# Patient Record
Sex: Female | Born: 1945 | Race: Black or African American | Hispanic: No
Health system: Southern US, Community
[De-identification: ages and names within clinical notes are randomized; demographics above are authoritative.]

## PROBLEM LIST (undated history)

## (undated) DIAGNOSIS — I10 Essential (primary) hypertension: Secondary | ICD-10-CM

## (undated) DIAGNOSIS — E119 Type 2 diabetes mellitus without complications: Secondary | ICD-10-CM

## (undated) DIAGNOSIS — I509 Heart failure, unspecified: Secondary | ICD-10-CM

---

## 2003-11-06 ENCOUNTER — Emergency Department (HOSPITAL_COMMUNITY): Admission: EM | Admit: 2003-11-06 | Discharge: 2003-11-06 | Payer: Self-pay | Admitting: *Deleted

## 2004-06-15 ENCOUNTER — Encounter: Admission: RE | Admit: 2004-06-15 | Discharge: 2004-06-15 | Payer: Self-pay | Admitting: Family Medicine

## 2014-09-13 DIAGNOSIS — Z9114 Patient's other noncompliance with medication regimen: Secondary | ICD-10-CM | POA: Diagnosis not present

## 2014-09-13 DIAGNOSIS — E785 Hyperlipidemia, unspecified: Secondary | ICD-10-CM | POA: Diagnosis not present

## 2014-09-13 DIAGNOSIS — E1165 Type 2 diabetes mellitus with hyperglycemia: Secondary | ICD-10-CM | POA: Diagnosis not present

## 2014-09-13 DIAGNOSIS — R531 Weakness: Secondary | ICD-10-CM | POA: Diagnosis not present

## 2014-09-13 DIAGNOSIS — E1142 Type 2 diabetes mellitus with diabetic polyneuropathy: Secondary | ICD-10-CM | POA: Diagnosis not present

## 2014-09-13 DIAGNOSIS — I10 Essential (primary) hypertension: Secondary | ICD-10-CM | POA: Diagnosis not present

## 2014-09-13 DIAGNOSIS — E1140 Type 2 diabetes mellitus with diabetic neuropathy, unspecified: Secondary | ICD-10-CM | POA: Diagnosis not present

## 2014-09-25 DIAGNOSIS — Z6828 Body mass index (BMI) 28.0-28.9, adult: Secondary | ICD-10-CM | POA: Diagnosis not present

## 2014-09-25 DIAGNOSIS — E1165 Type 2 diabetes mellitus with hyperglycemia: Secondary | ICD-10-CM | POA: Diagnosis not present

## 2014-11-01 DIAGNOSIS — Z6829 Body mass index (BMI) 29.0-29.9, adult: Secondary | ICD-10-CM | POA: Diagnosis not present

## 2014-11-01 DIAGNOSIS — I10 Essential (primary) hypertension: Secondary | ICD-10-CM | POA: Diagnosis not present

## 2014-11-01 DIAGNOSIS — E1140 Type 2 diabetes mellitus with diabetic neuropathy, unspecified: Secondary | ICD-10-CM | POA: Diagnosis not present

## 2014-11-01 DIAGNOSIS — R531 Weakness: Secondary | ICD-10-CM | POA: Diagnosis not present

## 2014-11-12 DIAGNOSIS — E1140 Type 2 diabetes mellitus with diabetic neuropathy, unspecified: Secondary | ICD-10-CM | POA: Diagnosis not present

## 2014-11-12 DIAGNOSIS — Z0000 Encounter for general adult medical examination without abnormal findings: Secondary | ICD-10-CM | POA: Diagnosis not present

## 2014-12-28 DIAGNOSIS — H2513 Age-related nuclear cataract, bilateral: Secondary | ICD-10-CM | POA: Diagnosis not present

## 2015-01-14 DIAGNOSIS — E785 Hyperlipidemia, unspecified: Secondary | ICD-10-CM | POA: Diagnosis not present

## 2015-01-14 DIAGNOSIS — I10 Essential (primary) hypertension: Secondary | ICD-10-CM | POA: Diagnosis not present

## 2015-01-14 DIAGNOSIS — E1140 Type 2 diabetes mellitus with diabetic neuropathy, unspecified: Secondary | ICD-10-CM | POA: Diagnosis not present

## 2015-01-14 DIAGNOSIS — E1142 Type 2 diabetes mellitus with diabetic polyneuropathy: Secondary | ICD-10-CM | POA: Diagnosis not present

## 2015-01-14 DIAGNOSIS — E538 Deficiency of other specified B group vitamins: Secondary | ICD-10-CM | POA: Diagnosis not present

## 2015-01-25 ENCOUNTER — Emergency Department (HOSPITAL_COMMUNITY): Payer: Medicare Other

## 2015-01-25 ENCOUNTER — Encounter (HOSPITAL_COMMUNITY): Payer: Self-pay

## 2015-01-25 ENCOUNTER — Inpatient Hospital Stay (HOSPITAL_COMMUNITY)
Admission: EM | Admit: 2015-01-25 | Discharge: 2015-01-25 | DRG: 062 | Disposition: A | Discharge status: Other Acute Inpatient Hospital | Payer: Medicare Other | Attending: Neurology | Admitting: Neurology

## 2015-01-25 ENCOUNTER — Inpatient Hospital Stay (HOSPITAL_COMMUNITY): Payer: Medicare Other

## 2015-01-25 DIAGNOSIS — R1310 Dysphagia, unspecified: Secondary | ICD-10-CM | POA: Diagnosis not present

## 2015-01-25 DIAGNOSIS — I63511 Cerebral infarction due to unspecified occlusion or stenosis of right middle cerebral artery: Secondary | ICD-10-CM | POA: Diagnosis not present

## 2015-01-25 DIAGNOSIS — Z9282 Status post administration of tPA (rtPA) in a different facility within the last 24 hours prior to admission to current facility: Secondary | ICD-10-CM | POA: Diagnosis not present

## 2015-01-25 DIAGNOSIS — I663 Occlusion and stenosis of cerebellar arteries: Secondary | ICD-10-CM | POA: Diagnosis not present

## 2015-01-25 DIAGNOSIS — R471 Dysarthria and anarthria: Secondary | ICD-10-CM | POA: Diagnosis not present

## 2015-01-25 DIAGNOSIS — Z9889 Other specified postprocedural states: Secondary | ICD-10-CM | POA: Diagnosis not present

## 2015-01-25 DIAGNOSIS — Z8249 Family history of ischemic heart disease and other diseases of the circulatory system: Secondary | ICD-10-CM | POA: Diagnosis not present

## 2015-01-25 DIAGNOSIS — J811 Chronic pulmonary edema: Secondary | ICD-10-CM | POA: Diagnosis not present

## 2015-01-25 DIAGNOSIS — I110 Hypertensive heart disease with heart failure: Secondary | ICD-10-CM | POA: Diagnosis not present

## 2015-01-25 DIAGNOSIS — I340 Nonrheumatic mitral (valve) insufficiency: Secondary | ICD-10-CM | POA: Diagnosis not present

## 2015-01-25 DIAGNOSIS — N189 Chronic kidney disease, unspecified: Secondary | ICD-10-CM | POA: Diagnosis not present

## 2015-01-25 DIAGNOSIS — R404 Transient alteration of awareness: Secondary | ICD-10-CM | POA: Diagnosis not present

## 2015-01-25 DIAGNOSIS — I509 Heart failure, unspecified: Secondary | ICD-10-CM | POA: Diagnosis not present

## 2015-01-25 DIAGNOSIS — R739 Hyperglycemia, unspecified: Secondary | ICD-10-CM | POA: Diagnosis not present

## 2015-01-25 DIAGNOSIS — F05 Delirium due to known physiological condition: Secondary | ICD-10-CM | POA: Diagnosis not present

## 2015-01-25 DIAGNOSIS — G8194 Hemiplegia, unspecified affecting left nondominant side: Secondary | ICD-10-CM | POA: Diagnosis not present

## 2015-01-25 DIAGNOSIS — B965 Pseudomonas (aeruginosa) (mallei) (pseudomallei) as the cause of diseases classified elsewhere: Secondary | ICD-10-CM | POA: Diagnosis not present

## 2015-01-25 DIAGNOSIS — J9601 Acute respiratory failure with hypoxia: Secondary | ICD-10-CM | POA: Diagnosis not present

## 2015-01-25 DIAGNOSIS — Z515 Encounter for palliative care: Secondary | ICD-10-CM | POA: Diagnosis not present

## 2015-01-25 DIAGNOSIS — H53462 Homonymous bilateral field defects, left side: Secondary | ICD-10-CM | POA: Diagnosis not present

## 2015-01-25 DIAGNOSIS — I6789 Other cerebrovascular disease: Secondary | ICD-10-CM | POA: Diagnosis not present

## 2015-01-25 DIAGNOSIS — I10 Essential (primary) hypertension: Secondary | ICD-10-CM | POA: Diagnosis not present

## 2015-01-25 DIAGNOSIS — I639 Cerebral infarction, unspecified: Secondary | ICD-10-CM | POA: Diagnosis present

## 2015-01-25 DIAGNOSIS — G936 Cerebral edema: Secondary | ICD-10-CM | POA: Diagnosis not present

## 2015-01-25 DIAGNOSIS — E785 Hyperlipidemia, unspecified: Secondary | ICD-10-CM | POA: Diagnosis not present

## 2015-01-25 DIAGNOSIS — Z4682 Encounter for fitting and adjustment of non-vascular catheter: Secondary | ICD-10-CM | POA: Diagnosis not present

## 2015-01-25 DIAGNOSIS — B961 Klebsiella pneumoniae [K. pneumoniae] as the cause of diseases classified elsewhere: Secondary | ICD-10-CM | POA: Diagnosis not present

## 2015-01-25 DIAGNOSIS — F0390 Unspecified dementia without behavioral disturbance: Secondary | ICD-10-CM | POA: Diagnosis not present

## 2015-01-25 DIAGNOSIS — I313 Pericardial effusion (noninflammatory): Secondary | ICD-10-CM | POA: Diagnosis not present

## 2015-01-25 DIAGNOSIS — N179 Acute kidney failure, unspecified: Secondary | ICD-10-CM | POA: Diagnosis not present

## 2015-01-25 DIAGNOSIS — N390 Urinary tract infection, site not specified: Secondary | ICD-10-CM | POA: Diagnosis not present

## 2015-01-25 DIAGNOSIS — R918 Other nonspecific abnormal finding of lung field: Secondary | ICD-10-CM | POA: Diagnosis not present

## 2015-01-25 DIAGNOSIS — R29810 Facial weakness: Secondary | ICD-10-CM | POA: Diagnosis not present

## 2015-01-25 DIAGNOSIS — E160 Drug-induced hypoglycemia without coma: Secondary | ICD-10-CM | POA: Diagnosis not present

## 2015-01-25 DIAGNOSIS — R1312 Dysphagia, oropharyngeal phase: Secondary | ICD-10-CM | POA: Diagnosis not present

## 2015-01-25 DIAGNOSIS — I358 Other nonrheumatic aortic valve disorders: Secondary | ICD-10-CM | POA: Diagnosis not present

## 2015-01-25 DIAGNOSIS — I5023 Acute on chronic systolic (congestive) heart failure: Secondary | ICD-10-CM | POA: Diagnosis not present

## 2015-01-25 DIAGNOSIS — I6529 Occlusion and stenosis of unspecified carotid artery: Secondary | ICD-10-CM | POA: Diagnosis not present

## 2015-01-25 DIAGNOSIS — I071 Rheumatic tricuspid insufficiency: Secondary | ICD-10-CM | POA: Diagnosis not present

## 2015-01-25 DIAGNOSIS — I6523 Occlusion and stenosis of bilateral carotid arteries: Secondary | ICD-10-CM | POA: Diagnosis not present

## 2015-01-25 DIAGNOSIS — I130 Hypertensive heart and chronic kidney disease with heart failure and stage 1 through stage 4 chronic kidney disease, or unspecified chronic kidney disease: Secondary | ICD-10-CM | POA: Diagnosis not present

## 2015-01-25 DIAGNOSIS — E118 Type 2 diabetes mellitus with unspecified complications: Secondary | ICD-10-CM | POA: Diagnosis not present

## 2015-01-25 DIAGNOSIS — I63311 Cerebral infarction due to thrombosis of right middle cerebral artery: Secondary | ICD-10-CM | POA: Diagnosis not present

## 2015-01-25 DIAGNOSIS — I517 Cardiomegaly: Secondary | ICD-10-CM | POA: Diagnosis not present

## 2015-01-25 DIAGNOSIS — G8104 Flaccid hemiplegia affecting left nondominant side: Secondary | ICD-10-CM | POA: Diagnosis not present

## 2015-01-25 DIAGNOSIS — I672 Cerebral atherosclerosis: Secondary | ICD-10-CM | POA: Diagnosis not present

## 2015-01-25 DIAGNOSIS — E1140 Type 2 diabetes mellitus with diabetic neuropathy, unspecified: Secondary | ICD-10-CM | POA: Diagnosis not present

## 2015-01-25 DIAGNOSIS — I618 Other nontraumatic intracerebral hemorrhage: Secondary | ICD-10-CM | POA: Diagnosis not present

## 2015-01-25 DIAGNOSIS — E119 Type 2 diabetes mellitus without complications: Secondary | ICD-10-CM | POA: Diagnosis present

## 2015-01-25 DIAGNOSIS — I63411 Cerebral infarction due to embolism of right middle cerebral artery: Secondary | ICD-10-CM | POA: Diagnosis not present

## 2015-01-25 DIAGNOSIS — R29721 NIHSS score 21: Secondary | ICD-10-CM | POA: Diagnosis present

## 2015-01-25 DIAGNOSIS — R410 Disorientation, unspecified: Secondary | ICD-10-CM | POA: Diagnosis not present

## 2015-01-25 DIAGNOSIS — E46 Unspecified protein-calorie malnutrition: Secondary | ICD-10-CM | POA: Diagnosis not present

## 2015-01-25 DIAGNOSIS — I6782 Cerebral ischemia: Secondary | ICD-10-CM | POA: Diagnosis not present

## 2015-01-25 DIAGNOSIS — E876 Hypokalemia: Secondary | ICD-10-CM | POA: Diagnosis not present

## 2015-01-25 DIAGNOSIS — G9340 Encephalopathy, unspecified: Secondary | ICD-10-CM | POA: Diagnosis not present

## 2015-01-25 DIAGNOSIS — T383X5A Adverse effect of insulin and oral hypoglycemic [antidiabetic] drugs, initial encounter: Secondary | ICD-10-CM | POA: Diagnosis not present

## 2015-01-25 DIAGNOSIS — G9341 Metabolic encephalopathy: Secondary | ICD-10-CM | POA: Diagnosis not present

## 2015-01-25 DIAGNOSIS — I5022 Chronic systolic (congestive) heart failure: Secondary | ICD-10-CM | POA: Diagnosis not present

## 2015-01-25 DIAGNOSIS — E1165 Type 2 diabetes mellitus with hyperglycemia: Secondary | ICD-10-CM | POA: Diagnosis not present

## 2015-01-25 DIAGNOSIS — I610 Nontraumatic intracerebral hemorrhage in hemisphere, subcortical: Secondary | ICD-10-CM | POA: Diagnosis not present

## 2015-01-25 DIAGNOSIS — E870 Hyperosmolality and hypernatremia: Secondary | ICD-10-CM | POA: Diagnosis not present

## 2015-01-25 HISTORY — DX: Heart failure, unspecified: I50.9

## 2015-01-25 HISTORY — DX: Type 2 diabetes mellitus without complications: E11.9

## 2015-01-25 HISTORY — DX: Essential (primary) hypertension: I10

## 2015-01-25 LAB — DIFFERENTIAL
BASOS PCT: 0 %
Basophils Absolute: 0 10*3/uL (ref 0.0–0.1)
EOS PCT: 2 %
Eosinophils Absolute: 0.2 10*3/uL (ref 0.0–0.7)
Lymphocytes Relative: 28 %
Lymphs Abs: 2.3 10*3/uL (ref 0.7–4.0)
MONO ABS: 1 10*3/uL (ref 0.1–1.0)
Monocytes Relative: 12 %
NEUTROS ABS: 4.8 10*3/uL (ref 1.7–7.7)
NEUTROS PCT: 58 %

## 2015-01-25 LAB — COMPREHENSIVE METABOLIC PANEL
ALBUMIN: 3.5 g/dL (ref 3.5–5.0)
ALT: 17 U/L (ref 14–54)
ANION GAP: 8 (ref 5–15)
AST: 15 U/L (ref 15–41)
Alkaline Phosphatase: 52 U/L (ref 38–126)
BUN: 15 mg/dL (ref 6–20)
CHLORIDE: 106 mmol/L (ref 101–111)
CO2: 27 mmol/L (ref 22–32)
Calcium: 10 mg/dL (ref 8.9–10.3)
Creatinine, Ser: 0.79 mg/dL (ref 0.44–1.00)
GFR calc non Af Amer: >60 mL/min (ref >60)
GLUCOSE: 181 mg/dL — AB (ref 65–99)
Potassium: 3.3 mmol/L — ABNORMAL LOW (ref 3.5–5.1)
SODIUM: 141 mmol/L (ref 135–145)
Total Bilirubin: 0.9 mg/dL (ref 0.3–1.2)
Total Protein: 6.5 g/dL (ref 6.5–8.1)

## 2015-01-25 LAB — PROTIME-INR
INR: 1.1 (ref 0.00–1.49)
PROTHROMBIN TIME: 14.4 s (ref 11.6–15.2)

## 2015-01-25 LAB — I-STAT CHEM 8, ED
BUN: 16 mg/dL (ref 6–20)
CALCIUM ION: 1.26 mmol/L (ref 1.13–1.30)
CHLORIDE: 103 mmol/L (ref 101–111)
Creatinine, Ser: 0.8 mg/dL (ref 0.44–1.00)
Glucose, Bld: 176 mg/dL — ABNORMAL HIGH (ref 65–99)
HEMATOCRIT: 35 % — AB (ref 36.0–46.0)
Hemoglobin: 11.9 g/dL — ABNORMAL LOW (ref 12.0–15.0)
POTASSIUM: 3.3 mmol/L — AB (ref 3.5–5.1)
SODIUM: 142 mmol/L (ref 135–145)
TCO2: 27 mmol/L (ref 0–100)

## 2015-01-25 LAB — CBC
HCT: 33.3 % — ABNORMAL LOW (ref 36.0–46.0)
Hemoglobin: 11.2 g/dL — ABNORMAL LOW (ref 12.0–15.0)
MCH: 28.1 pg (ref 26.0–34.0)
MCHC: 33.6 g/dL (ref 30.0–36.0)
MCV: 83.7 fL (ref 78.0–100.0)
PLATELETS: 224 10*3/uL (ref 150–400)
RBC: 3.98 MIL/uL (ref 3.87–5.11)
RDW: 13 % (ref 11.5–15.5)
WBC: 8.2 10*3/uL (ref 4.0–10.5)

## 2015-01-25 LAB — CBG MONITORING, ED: GLUCOSE-CAPILLARY: 162 mg/dL — AB (ref 65–99)

## 2015-01-25 LAB — ETHANOL

## 2015-01-25 LAB — APTT: aPTT: 32 seconds (ref 24–37)

## 2015-01-25 LAB — I-STAT TROPONIN, ED: Troponin i, poc: 0.05 ng/mL (ref 0.00–0.08)

## 2015-01-25 MED ORDER — LABETALOL HCL 5 MG/ML IV SOLN
20.0000 mg | Freq: Once | INTRAVENOUS | Status: DC
  Filled 2015-01-25: qty 4

## 2015-01-25 MED ORDER — ALTEPLASE (STROKE) FULL DOSE INFUSION
0.9000 mg/kg | Freq: Once | INTRAVENOUS | Status: AC
  Administered 2015-01-25: 71 mg via INTRAVENOUS
  Filled 2015-01-25: qty 71

## 2015-01-25 MED ORDER — IOHEXOL 350 MG/ML SOLN
80.0000 mL | Freq: Once | INTRAVENOUS | Status: AC | PRN
  Administered 2015-01-25: 80 mL via INTRAVENOUS

## 2015-01-25 MED ORDER — SENNOSIDES-DOCUSATE SODIUM 8.6-50 MG PO TABS: 1.0000 | ORAL_TABLET | Freq: Every evening | ORAL | Status: DC | PRN

## 2015-01-25 MED ORDER — ACETAMINOPHEN 650 MG RE SUPP: 650.0000 mg | RECTAL | Status: DC | PRN

## 2015-01-25 MED ORDER — PANTOPRAZOLE SODIUM 40 MG IV SOLR: 40.0000 mg | Freq: Every day | INTRAVENOUS | Status: DC

## 2015-01-25 MED ORDER — LABETALOL HCL 5 MG/ML IV SOLN: 10.0000 mg | INTRAVENOUS | Status: DC | PRN

## 2015-01-25 MED ORDER — LABETALOL HCL 5 MG/ML IV SOLN
10.0000 mg | Freq: Once | INTRAVENOUS | Status: AC
  Administered 2015-01-25: 10 mg via INTRAVENOUS

## 2015-01-25 MED ORDER — STROKE: EARLY STAGES OF RECOVERY BOOK
Freq: Once | Status: DC
  Filled 2015-01-25: qty 1

## 2015-01-25 MED ORDER — SODIUM CHLORIDE 0.9 % IV SOLN: INTRAVENOUS | Status: DC

## 2015-01-25 MED ORDER — ACETAMINOPHEN 325 MG PO TABS: 650.0000 mg | ORAL_TABLET | ORAL | Status: DC | PRN

## 2015-01-25 NOTE — ED Notes (Signed)
Per EMS: Pt LSN 1630 today. Pt was out doing errands with husband, went to bathroom and was found on the floor by family. EMS states pt a/o with some aphasia. Pt is L sided paralysis, and facial droop with R sided gaze.

## 2015-01-25 NOTE — ED Notes (Signed)
ED MD at bedside. Neurology at bedside.

## 2015-01-25 NOTE — H&P (Signed)
Admission H&P    Chief Complaint: code stroke, left hemiparesis, dysarthria, left face weakness HPI: Lavena BullionRuth Hunsinger is an 69 y.o. female with a past medical history significant for HTN, DM, chronic congestive heart failure, brought in by EMS as a code stroke due to acute onsedt of the above stated symptoms. As per EMS, patient was home with her husband who found her confused, poorly responsive, unable to move the left side and with gaze preference to the right.  Initial NIHSS 21. CT brain was personally reviewed and showed no acute abnormality. She was hypertensive in the ED with SBP 190 and was given 10 mg IV labetalol. Patient is currently awake and alert and follows commands without difficulty.  LSN: 4:30 pm, 01/25/15 NIHSS:21 tPA Given: yes Modified rankin score:0   Past Medical History  Diagnosis Date  . Diabetes mellitus without complication (HCC)   . Hypertension   . CHF (congestive heart failure) (HCC)     History reviewed. No pertinent past surgical history.  History reviewed. No pertinent family history. Social History:  reports that she does not drink alcohol or use illicit drugs. Her tobacco history is not on file.  Allergies:  Allergies  Allergen Reactions  . Penicillins      (Not in a hospital admission)  ROS: no able to obtain due to marked dysarthria and clinical status   Physical Examination: Weight 79.3 kg (174 lb 13.2 oz).  HEENT-  Normocephalic, no lesions, without obvious abnormality.  Normal external eye and conjunctiva.  Normal TM's bilaterally.  Normal auditory canals and external ears. Normal external nose, mucus membranes and septum.  Normal pharynx. Neck supple with no masses, nodes, nodules or enlargement. Cardiovascular - regular rate and rhythm, S1, S2 normal, no murmur, click, rub or gallop Lungs - chest clear, no wheezing, rales, normal symmetric air entry, Heart exam - S1, S2 normal, no murmur, no gallop, rate regular Abdomen - soft,  non-tender; bowel sounds normal; no masses,  no organomegaly Extremities - no joint deformities, effusion, or inflammation  Neurologic Examination: General: NAD Mental Status: Alert, oriented to person and place. Marked dysarthria. Follows commands appropriately Cranial Nerves: II: Discs flat bilaterally; Visual fields impaired in the left, pupils equal, round, reactive to light and accommodation III,IV, VI: ptosis not present, extra-ocular motions intact bilaterally V,VII: smile asymmetric due to leftr face weakness, facial light touch sensation decreased in the left VIII: hearing normal bilaterally IX,X: uvula rises symmetrically XI: bilateral shoulder shrug no tested XII: midline tongue extension without atrophy or fasciculations Motor: Dense left hemiparesis arm>>>leg Tone decreased in the left Sensory: Pinprick and light touch decreased in the left Deep Tendon Reflexes:  1+all over Plantars: Right: downgoing   Left: mute Cerebellar: normal finger-to-nose in the left,  Can not perform heel-to-shin test due to weeakness Gait:  Unable to test due to clinical condition    Results for orders placed or performed during the hospital encounter of 01/25/15 (from the past 48 hour(s))  CBG monitoring, ED     Status: Abnormal   Collection Time: 01/25/15  6:30 PM  Result Value Ref Range   Glucose-Capillary 162 (H) 65 - 99 mg/dL  CBC     Status: Abnormal   Collection Time: 01/25/15  6:36 PM  Result Value Ref Range   WBC 8.2 4.0 - 10.5 K/uL   RBC 3.98 3.87 - 5.11 MIL/uL   Hemoglobin 11.2 (L) 12.0 - 15.0 g/dL   HCT 11.933.3 (L) 14.736.0 - 82.946.0 %   MCV 83.7  78.0 - 100.0 fL   MCH 28.1 26.0 - 34.0 pg   MCHC 33.6 30.0 - 36.0 g/dL   RDW 16.1 09.6 - 04.5 %   Platelets 224 150 - 400 K/uL  Differential     Status: None   Collection Time: 01/25/15  6:36 PM  Result Value Ref Range   Neutrophils Relative % 58 %   Neutro Abs 4.8 1.7 - 7.7 K/uL   Lymphocytes Relative 28 %   Lymphs Abs 2.3 0.7 -  4.0 K/uL   Monocytes Relative 12 %   Monocytes Absolute 1.0 0.1 - 1.0 K/uL   Eosinophils Relative 2 %   Eosinophils Absolute 0.2 0.0 - 0.7 K/uL   Basophils Relative 0 %   Basophils Absolute 0.0 0.0 - 0.1 K/uL  I-Stat Chem 8, ED  (not at [facility], [facility])     Status: Abnormal   Collection Time: 01/25/15  6:37 PM  Result Value Ref Range   Sodium 142 135 - 145 mmol/L   Potassium 3.3 (L) 3.5 - 5.1 mmol/L   Chloride 103 101 - 111 mmol/L   BUN 16 6 - 20 mg/dL   Creatinine, Ser 4.09 0.44 - 1.00 mg/dL   Glucose, Bld 811 (H) 65 - 99 mg/dL   Calcium, Ion 9.14 7.82 - 1.30 mmol/L   TCO2 27 0 - 100 mmol/L   Hemoglobin 11.9 (L) 12.0 - 15.0 g/dL   HCT 95.6 (L) 21.3 - 08.6 %   Ct Head Wo Contrast  01/25/2015  CLINICAL DATA:  Code stroke, right-side gaze, complete left-sided paralysis and facial droop, found unresponsive EXAM: CT HEAD WITHOUT CONTRAST TECHNIQUE: Contiguous axial images were obtained from the base of the skull through the vertex without intravenous contrast. COMPARISON:  None. FINDINGS: Generalized atrophy. Normal ventricular morphology. No midline shift or mass effect. Small vessel chronic ischemic changes of deep cerebral white matter. Old appearing periventricular white matter infarct in LEFT frontal region. Question RIGHT basal ganglia lacunar infarct. No intracranial hemorrhage, mass lesion or evidence of acute cortical infarction. No extra-axial fluid collections. Visualized paranasal sinuses and mastoid air cells clear. Atherosclerotic calcification of internal carotid and vertebral arteries at skullbase. IMPRESSION: Atrophy with small vessel chronic ischemic changes of deep cerebral white matter. 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 No acute intracranial hemorrhage. Findings called to Dr. Leroy Kennedy on 01/25/2015 at 1833 hours. Electronically Signed   By: Ulyses Southward M.D.   On: 01/25/2015 18:34    Assessment: 69 y.o. female with  a constellation of symptoms and findings on neuro-exam consistent with a large right MCA distribution infarct. NIHSS 21. CT brain without acute abnormality. Patient meets criteria for iv tPA and will proceed accordingly. Suspect large arterial occlusion and will get CTA brain and neck to determine if she is an endovascular candidate. Admit to NICU and follow post thrombolysis protocol. Stroke team will follow tomorrow.  Stroke Risk Factors - age, HTN, DM, chronic congestive heart failure.  Plan: 1. HgbA1c, fasting lipid panel 2. MRI, MRA  of the brain without contrast 3. PT consult, OT consult, Speech consult 4. Echocardiogram 5. Carotid dopplers 6. Prophylactic therapy-as per protocol 7. Risk factor modification 7. Telemetry monitoring   Wyatt Portela, MD [facility] [phone]  01/25/2015, 6:43 PM

## 2015-01-25 NOTE — Progress Notes (Signed)
Due to simultaneous code patients, I assumed care of Annette Mccormick and accompanied her to the CT scanner where a CT angiogram was performed. I discussed with the radiologist and reviewed the films which reveal a large right MCA occlusion consistent with her symptoms. She is still within the time frame for IR and therefore Dr. Lanna Mccormick at [facility] was contacted who accepted the patient in transfer and transport was arranged and she was transferred to [facility], Dr. Lanna Mccormick accepting.   She continues to have a dense left hemiparesis with severe neglect, gaze deviation, etc but minimal change on CT.   This patient is critically ill and at significant risk of neurological worsening, death and care requires constant monitoring of vital signs, hemodynamics,respiratory and cardiac monitoring, neurological assessment, discussion with family, other specialists and medical decision making of high complexity. I spent 45 minutes of neurocritical care time  in the care of  this patient.  Annette SlotMcNeill Orange Hilligoss, MD [facility] [phone]  If 7pm- 7am, please page neurology on call as listed in AMION. 01/25/2015  8:27 PM

## 2015-01-26 NOTE — ED Provider Notes (Addendum)
CSN: [id number]     Arrival date & time 01/25/15  1815 History   First MD Initiated Contact with Patient 01/25/15 1837     Chief Complaint  Patient presents with  . Code Stroke    An emergency department physician performed an initial assessment on this suspected stroke patient at 1815. (Consider location/radiation/quality/duration/timing/severity/associated sxs/prior Treatment) HPI Comments: 69 y.o. Female with history of DM, HTN, CHF presents for left sided weakness.  The patient's husband reports that he went to use the bathroom at 4:30 PM and at that time the patient was in her normal health but when he came out of the bathroom about 30 minutes later the patient was laying on the floor and was noted to have left facial droop and left arm and leg weakness.  EMS was called and they also noted the patient had a rightward gaze that was fixed.  Patient denies headache.  She is answering questions appropriately but asks why we think she may have had a stroke.  PAtient not reported to be on any anticoagulation and has not had any known recent surgeries.   Past Medical History  Diagnosis Date  . Diabetes mellitus without complication (HCC)   . Hypertension   . CHF (congestive heart failure) (HCC)    History reviewed. No pertinent past surgical history. History reviewed. No pertinent family history. Social History  Substance Use Topics  . Smoking status: None  . Smokeless tobacco: None  . Alcohol Use: No   OB History    No data available     Review of Systems  Constitutional: Negative for fever, chills, diaphoresis and fatigue.  HENT: Negative for congestion, postnasal drip and rhinorrhea.   Eyes: Negative for pain and redness.  Respiratory: Negative for cough, chest tightness and shortness of breath.   Cardiovascular: Negative for chest pain and palpitations.  Gastrointestinal: Negative for nausea, abdominal pain and diarrhea.  Genitourinary: Negative for dysuria, urgency and  frequency.  Musculoskeletal: Negative for myalgias and back pain.  Skin: Negative for rash.  Neurological: Positive for facial asymmetry, speech difficulty and weakness (left arm and leg).      Allergies  Penicillins  Home Medications   Prior to Admission medications   Not on File   BP 174/86 mmHg  Pulse 72  Temp(Src) 98.2 F (36.8 C) (Core (Comment))  Resp 23  Wt 174 lb 13.2 oz (79.3 kg)  SpO2 99% Physical Exam  Constitutional: She appears well-developed and well-nourished. No distress.  HENT:  Head: Normocephalic and atraumatic.  Right Ear: External ear normal.  Left Ear: External ear normal.  Nose: Nose normal.  Mouth/Throat: Oropharynx is clear and moist. No oropharyngeal exudate.  Eyes: EOM are normal. Pupils are equal, round, and reactive to light.  Neck: Normal range of motion. Neck supple.  Cardiovascular: Normal rate, regular rhythm, normal heart sounds and intact distal pulses.   No murmur heard. Pulmonary/Chest: Effort normal. No respiratory distress. She has no wheezes. She has no rales.  Abdominal: Soft. She exhibits no distension. There is no tenderness.  Musculoskeletal: Normal range of motion. She exhibits no edema or tenderness.  Neurological: She is alert.  Patient oriented to time and person but did not know what city she was in although per EMS she was not told which hospital she was coming to and is from a different county.  Patient with complete weakness of the left arm and only able to wiggle toes slightly in left foot.  Patient with leftsided facial droop and  slurring of her speech.  Fixed right ward gaze with left neglect.  NIHSS 21  Skin: Skin is warm and dry. No rash noted. She is not diaphoretic.  Vitals reviewed.   ED Course  Procedures (including critical care time)  CRITICAL CARE Performed by: Larna Daughters   Total critical care time: 40 minutes  Critical care time was exclusive of separately billable procedures and treating other  patients.  Critical care was necessary to treat or prevent imminent or life-threatening deterioration.  Critical care was time spent personally by me on the following activities: development of treatment plan with patient and/or surrogate as well as nursing, discussions with consultants, evaluation of patient's response to treatment, examination of patient, obtaining history from patient or surrogate, ordering and performing treatments and interventions, ordering and review of laboratory studies, ordering and review of radiographic studies, pulse oximetry and re-evaluation of patient's condition.  Labs Review Labs Reviewed  CBC - Abnormal; Notable for the following:    Hemoglobin 11.2 (*)    HCT 33.3 (*)    All other components within normal limits  COMPREHENSIVE METABOLIC PANEL - Abnormal; Notable for the following:    Potassium 3.3 (*)    Glucose, Bld 181 (*)    All other components within normal limits  I-STAT CHEM 8, ED - Abnormal; Notable for the following:    Potassium 3.3 (*)    Glucose, Bld 176 (*)    Hemoglobin 11.9 (*)    HCT 35.0 (*)    All other components within normal limits  CBG MONITORING, ED - Abnormal; Notable for the following:    Glucose-Capillary 162 (*)    All other components within normal limits  ETHANOL  PROTIME-INR  APTT  DIFFERENTIAL  I-STAT TROPOININ, ED    Imaging Review Ct Angio Head W/cm &/or Wo Cm  01/25/2015  CLINICAL DATA:  Code stroke. Left-sided weakness. Initial encounter. CT head without contrast from the same day. EXAM: CT ANGIOGRAPHY HEAD AND NECK TECHNIQUE: Multidetector CT imaging of the head and neck was performed using the standard protocol during bolus administration of intravenous contrast. Multiplanar CT image reconstructions and MIPs were obtained to evaluate the vascular anatomy. Carotid stenosis measurements (when applicable) are obtained utilizing NASCET criteria, using the distal internal carotid diameter as the denominator.  CONTRAST:  80mL OMNIPAQUE IOHEXOL 350 MG/ML SOLN COMPARISON:  CT head without contrast from the same day. FINDINGS: CT HEAD Brain: The source images demonstrate no discrete infarct. The basal ganglia are intact. Insular ribbon is normal. White matter hypoattenuation in the left centrum semi ovale adjacent to the caudate head is likely remote. No acute hemorrhage or mass lesion is present. An empty enlarged sella is noted. Calvarium and skull base: The calvarium is intact. The skullbase is unremarkable. Paranasal sinuses: Clear Orbits: Within normal limits CTA NECK Aortic arch: A 3 vessel arch configuration is present. Atherosclerotic calcifications are present in the aorta without aneurysm. There is no focal stenosis at the origins of the great vessels. Right carotid system: The a heart common carotid artery and is within normal limits. Atherosclerotic calcifications are present at the bifurcation. There is focal tortuosity of the cervical ICA with less than 50% narrowing. Left carotid system: There is mild tortuosity of the left common carotid artery. Atherosclerotic calcifications are noted at the bifurcation without significant stenosis. There is moderate tortuosity of the cervical left ICA without significant stenosis. Vertebral arteries:The vertebral arteries originate from the subclavian arteries bilaterally. The right vertebral artery is slightly dominant  to the left. There is focal narrowing of the right vertebral artery of 60-70% at the C4 level. There is no focal stenosis of the left vertebral artery in the neck. There is significant stenosis of both vertebral arteries at the dural margin. Skeleton: Multilevel endplate degenerative change in uncovertebral spurring is present throughout the cervical spine. No focal lytic or blastic lesions are present. The mandible is intact and located. The patient is edentulous in the maxilla. The large dental caries is present within the residual left mandibular molar.  Other neck: No focal mucosal or submucosal lesions are present. There multiple thyroid nodules. No dominant nodules evident. There is no significant adenopathy. The salivary glands are within normal limits. Diffuse ground-glass attenuation is present in the lungs bilaterally. The superior mediastinum is within normal limits. CTA HEAD Anterior circulation: Dense atherosclerotic calcifications are present within the cavernous internal carotid arteries bilaterally. There is a moderate to high-grade stenosis within the ophthalmic segment on the right. Mild to moderate narrowing is present on the left. The ICA termini are intact. There moderate stenoses in the left A1 segment a mild narrowing in the right. The right M1 segment is occluded 10 mm from the bifurcation. The left M1 segment is intact. The left MCA bifurcation is intact. There is moderate segmental narrowing of left MCA branch vessels. There is a high-grade stenosis of the left A2 segment. Tandem stenoses are present in the ACA vessels bilaterally. Peripheral collateral vessels are present on the right with retrograde filling of MCA branches. There are collateral vessels on the left is well. Posterior circulation: High-grade stenoses are present in the vertebral arteries at dural margin bilaterally. The left vertebral artery is the dominant vessel the on the stenosis. The PICA origins are not well seen. There is a moderate stenosis of the mid basilar artery relative to the more distal segment. The PCA vessels both originate from the basilar tip. Moderate to high-grade proximal PCA stenoses are present bilaterally. Venous sinuses: The dural sinuses are patent. The right transverse sinus is dominant. The straight sinus and deep cerebral veins are intact. Cortical veins are within normal limits. Anatomic variants: None Delayed phase: Not performed in the code stroke setting IMPRESSION: 1. Proximal occlusion of the right M1 segment 10 mm from the ICA terminus. 2.  Moderate left mild right A1 segment stenoses. 3. Severe distally to tandem stenoses bilaterally. 4. 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 5. Peripheral collaterals are present bilaterally. 6. High-grade stenosis of the vertebral arteries at the dural margin bilaterally. 7. Moderate mid basilar artery stenosis. 8. High-grade proximal PCA stenoses bilaterally. 9. Atherosclerotic calcifications at the carotid bifurcations bilaterally without significant stenosis. 10. Marked tortuosity of the cervical vasculature. 11. Moderate stenosis of the right vertebral artery at the C4 level. 12. Multilevel spondylosis of the cervical spine. 13. Diffuse ground-glass attenuation in both lungs likely reflects atelectasis or edema. These results were called by telephone at the time of interpretation on 01/25/2015 at 7:35 pm to Dr. Amada Jupiter, who verbally acknowledged these results. Electronically Signed   By: Marin Roberts M.D.   On: 01/25/2015 19:57   Ct Head Wo Contrast  01/25/2015  CLINICAL DATA:  Code stroke, right-side gaze, complete left-sided paralysis and facial droop, found unresponsive EXAM: CT HEAD WITHOUT CONTRAST TECHNIQUE: Contiguous axial images were obtained from the base of the skull through the vertex without intravenous contrast. COMPARISON:  None. FINDINGS: Generalized atrophy. Normal ventricular morphology. No midline shift or mass effect. Small vessel chronic ischemic changes  of deep cerebral white matter. Old appearing periventricular white matter infarct in LEFT frontal region. Question RIGHT basal ganglia lacunar infarct. No intracranial hemorrhage, mass lesion or evidence of acute cortical infarction. No extra-axial fluid collections. Visualized paranasal sinuses and mastoid air cells clear. Atherosclerotic calcification of internal carotid and vertebral arteries at skullbase. IMPRESSION: Atrophy with small vessel chronic ischemic changes of deep cerebral white matter. 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 No acute intracranial hemorrhage. Findings called to Dr. Leroy Kennedy on 01/25/2015 at 1833 hours. Electronically Signed   By: Ulyses Southward M.D.   On: 01/25/2015 18:34   Ct Angio Neck W/cm &/or Wo/cm  01/25/2015  CLINICAL DATA:  Code stroke. Left-sided weakness. Initial encounter. CT head without contrast from the same day. EXAM: CT ANGIOGRAPHY HEAD AND NECK TECHNIQUE: Multidetector CT imaging of the head and neck was performed using the standard protocol during bolus administration of intravenous contrast. Multiplanar CT image reconstructions and MIPs were obtained to evaluate the vascular anatomy. Carotid stenosis measurements (when applicable) are obtained utilizing NASCET criteria, using the distal internal carotid diameter as the denominator. CONTRAST:  80mL OMNIPAQUE IOHEXOL 350 MG/ML SOLN COMPARISON:  CT head without contrast from the same day. FINDINGS: CT HEAD Brain: The source images demonstrate no discrete infarct. The basal ganglia are intact. Insular ribbon is normal. White matter hypoattenuation in the left centrum semi ovale adjacent to the caudate head is likely remote. No acute hemorrhage or mass lesion is present. An empty enlarged sella is noted. Calvarium and skull base: The calvarium is intact. The skullbase is unremarkable. Paranasal sinuses: Clear Orbits: Within normal limits CTA NECK Aortic arch: A 3 vessel arch configuration is present. Atherosclerotic calcifications are present in the aorta without aneurysm. There is no focal stenosis at the origins of the great vessels. Right carotid system: The a heart common carotid artery and is within normal limits. Atherosclerotic calcifications are present at the bifurcation. There is focal tortuosity of the cervical ICA with less than 50% narrowing. Left carotid system: There is mild tortuosity of the left common carotid artery. Atherosclerotic calcifications are  noted at the bifurcation without significant stenosis. There is moderate tortuosity of the cervical left ICA without significant stenosis. Vertebral arteries:The vertebral arteries originate from the subclavian arteries bilaterally. The right vertebral artery is slightly dominant to the left. There is focal narrowing of the right vertebral artery of 60-70% at the C4 level. There is no focal stenosis of the left vertebral artery in the neck. There is significant stenosis of both vertebral arteries at the dural margin. Skeleton: Multilevel endplate degenerative change in uncovertebral spurring is present throughout the cervical spine. No focal lytic or blastic lesions are present. The mandible is intact and located. The patient is edentulous in the maxilla. The large dental caries is present within the residual left mandibular molar. Other neck: No focal mucosal or submucosal lesions are present. There multiple thyroid nodules. No dominant nodules evident. There is no significant adenopathy. The salivary glands are within normal limits. Diffuse ground-glass attenuation is present in the lungs bilaterally. The superior mediastinum is within normal limits. CTA HEAD Anterior circulation: Dense atherosclerotic calcifications are present within the cavernous internal carotid arteries bilaterally. There is a moderate to high-grade stenosis within the ophthalmic segment on the right. Mild to moderate narrowing is present on the left. The ICA termini are intact. There moderate stenoses in the left A1 segment a mild narrowing in the right. The right  M1 segment is occluded 10 mm from the bifurcation. The left M1 segment is intact. The left MCA bifurcation is intact. There is moderate segmental narrowing of left MCA branch vessels. There is a high-grade stenosis of the left A2 segment. Tandem stenoses are present in the ACA vessels bilaterally. Peripheral collateral vessels are present on the right with retrograde filling of MCA  branches. There are collateral vessels on the left is well. Posterior circulation: High-grade stenoses are present in the vertebral arteries at dural margin bilaterally. The left vertebral artery is the dominant vessel the on the stenosis. The PICA origins are not well seen. There is a moderate stenosis of the mid basilar artery relative to the more distal segment. The PCA vessels both originate from the basilar tip. Moderate to high-grade proximal PCA stenoses are present bilaterally. Venous sinuses: The dural sinuses are patent. The right transverse sinus is dominant. The straight sinus and deep cerebral veins are intact. Cortical veins are within normal limits. Anatomic variants: None Delayed phase: Not performed in the code stroke setting IMPRESSION: 1. Proximal occlusion of the right M1 segment 10 mm from the ICA terminus. 2. Moderate left mild right A1 segment stenoses. 3. Severe distally to tandem stenoses bilaterally. 4. 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 5. Peripheral collaterals are present bilaterally. 6. High-grade stenosis of the vertebral arteries at the dural margin bilaterally. 7. Moderate mid basilar artery stenosis. 8. High-grade proximal PCA stenoses bilaterally. 9. Atherosclerotic calcifications at the carotid bifurcations bilaterally without significant stenosis. 10. Marked tortuosity of the cervical vasculature. 11. Moderate stenosis of the right vertebral artery at the C4 level. 12. Multilevel spondylosis of the cervical spine. 13. Diffuse ground-glass attenuation in both lungs likely reflects atelectasis or edema. These results were called by telephone at the time of interpretation on 01/25/2015 at 7:35 pm to Dr. Amada JupiterKirkpatrick, who verbally acknowledged these results. Electronically Signed   By: Marin Robertshristopher  Mattern M.D.   On: 01/25/2015 19:57   I have personally reviewed and evaluated these images and lab results as part of my medical decision-making.   EKG  Interpretation None      MDM  Patient was seen and evaluated immediately after head CT with neurology present.  She had significat neurologic deficits with no known contraindication to TPA and head CT was negative for hemorrhage.  TPA ordered.  Slight delay in administration secondary to HTN which was treated with labetalol with good result.  Plan was initially for patient to be admitted to the ICU under neurology but after CTA completed it was felt that patient should under go IR evaluation with possible intervention and was IR not available at our facility [facility]  Patient was transferred under neurology direction and coordination for more definitive management and IR evaluation. Final diagnoses:  Stroke with cerebral ischemia (HCC)    1. CVA    Leta BaptistEmily Roe Nguyen, MD 01/26/15 [id number]  Leta BaptistEmily Roe Nguyen, MD 01/26/15 [phone]

## 2015-01-27 DIAGNOSIS — I639 Cerebral infarction, unspecified: Secondary | ICD-10-CM | POA: Diagnosis not present

## 2015-01-27 DIAGNOSIS — J811 Chronic pulmonary edema: Secondary | ICD-10-CM | POA: Diagnosis not present

## 2015-01-27 DIAGNOSIS — R918 Other nonspecific abnormal finding of lung field: Secondary | ICD-10-CM | POA: Diagnosis not present

## 2015-01-27 DIAGNOSIS — I63411 Cerebral infarction due to embolism of right middle cerebral artery: Secondary | ICD-10-CM | POA: Diagnosis not present

## 2015-01-27 DIAGNOSIS — J9601 Acute respiratory failure with hypoxia: Secondary | ICD-10-CM | POA: Diagnosis not present

## 2015-01-27 DIAGNOSIS — I5023 Acute on chronic systolic (congestive) heart failure: Secondary | ICD-10-CM | POA: Diagnosis not present

## 2015-01-27 DIAGNOSIS — I618 Other nontraumatic intracerebral hemorrhage: Secondary | ICD-10-CM | POA: Diagnosis not present

## 2015-01-27 DIAGNOSIS — G936 Cerebral edema: Secondary | ICD-10-CM | POA: Diagnosis not present

## 2015-01-28 DIAGNOSIS — Z4682 Encounter for fitting and adjustment of non-vascular catheter: Secondary | ICD-10-CM | POA: Diagnosis not present

## 2015-03-13 DEATH — deceased

## 2017-02-02 IMAGING — CT CT ANGIO HEAD
1 of 9 series · 6 of 47 positions shown · IV contrast (OMNI)
Comparison: CT head without contrast from the same day.

CLINICAL DATA: Code stroke. Left-sided weakness. Initial encounter.
CT head without contrast from the same day.



[Series 5: carotid/brain 2.0 i30f 3 · axial · 0.49mm/px · z∈[-282,-42]mm · 6 of 169 slices shown]
[im 25/169  brain]
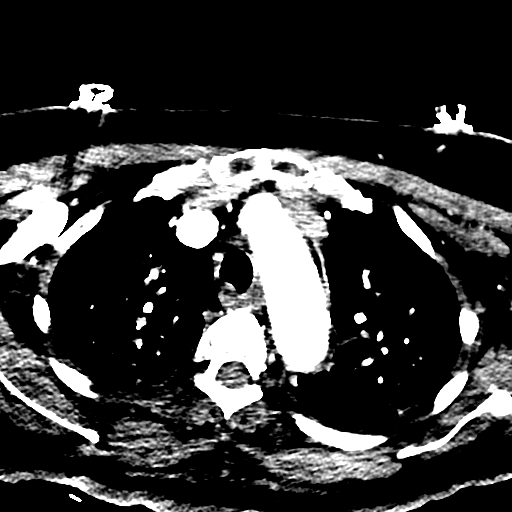
[im 49/169  bone]
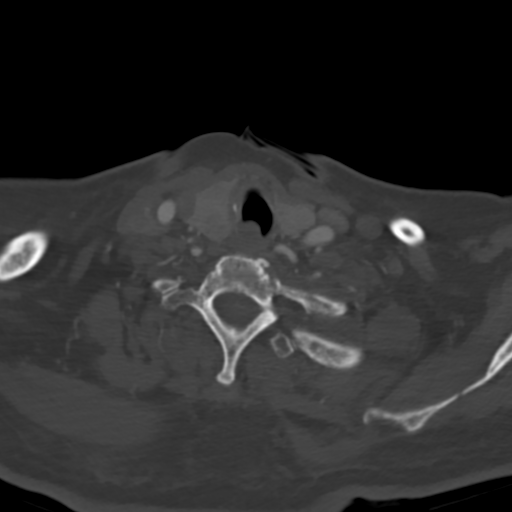
[im 73/169  brain]
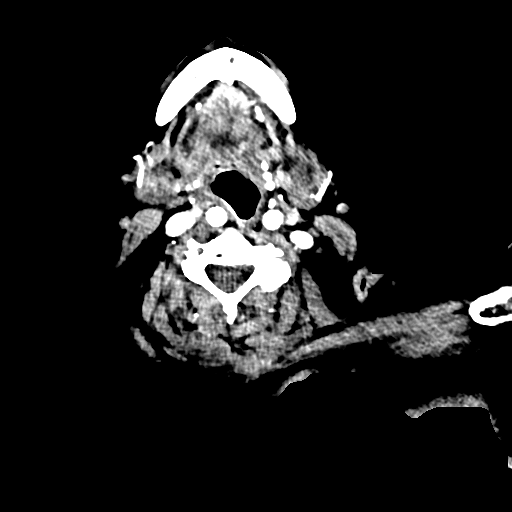
[im 97/169  bone]
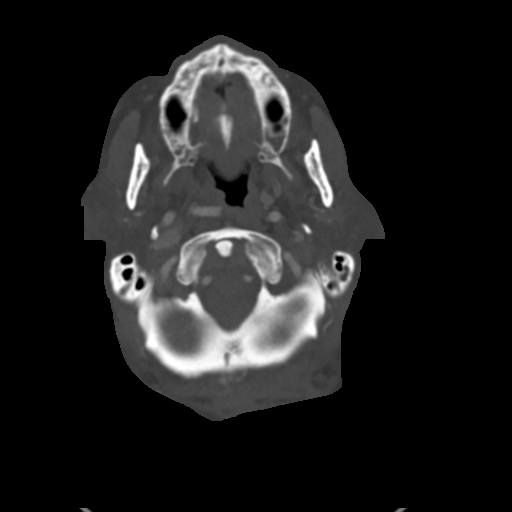
[im 121/169  brain]
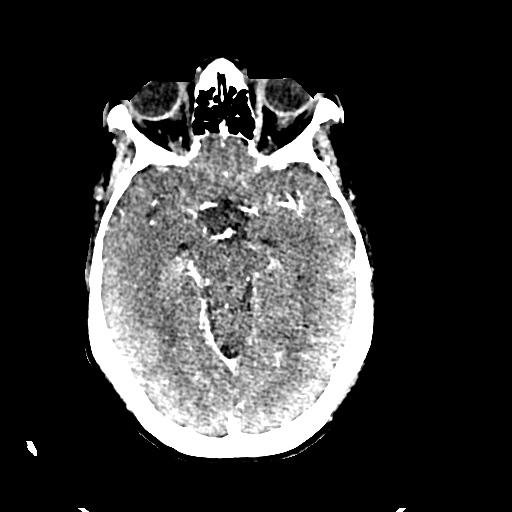
[im 145/169  bone]
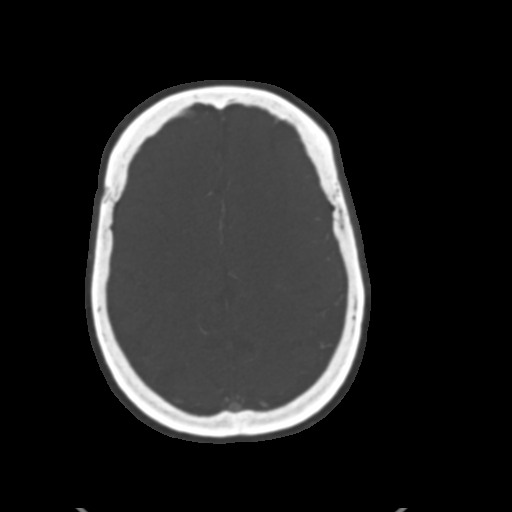

[6 of 47 positions shown; findings below may reference images not displayed]

FINDINGS: CT HEAD

Brain: The source images demonstrate no discrete infarct. The basal
ganglia are intact. Insular ribbon is normal. White matter
hypoattenuation in the left centrum semi ovale adjacent to the
caudate head is likely remote. No acute hemorrhage or mass lesion is
present. An empty enlarged sella is noted.

Calvarium and skull base: The calvarium is intact. The skullbase is
unremarkable.

Paranasal sinuses: Clear

Orbits: Within normal limits

CTA NECK

Aortic arch: A 3 vessel arch configuration is present.
Atherosclerotic calcifications are present in the aorta without
aneurysm. There is no focal stenosis at the origins of the great
vessels.

Right carotid system: The a heart common carotid artery and is
within normal limits. Atherosclerotic calcifications are present at
the bifurcation. There is focal tortuosity of the cervical ICA with
less than 50% narrowing.

Left carotid system: There is mild tortuosity of the left common
carotid artery. Atherosclerotic calcifications are noted at the
bifurcation without significant stenosis. There is moderate
tortuosity of the cervical left ICA without significant stenosis.

Vertebral arteries:The vertebral arteries originate from the
subclavian arteries bilaterally. The right vertebral artery is
slightly dominant to the left. There is focal narrowing of the right
vertebral artery of 60-70% at the C4 level. There is no focal
stenosis of the left vertebral artery in the neck. There is
significant stenosis of both vertebral arteries at the dural margin.

Skeleton: Multilevel endplate degenerative change in uncovertebral
spurring is present throughout the cervical spine. No focal lytic or
blastic lesions are present. The mandible is intact and located. The
patient is edentulous in the maxilla. The large dental caries is
present within the residual left mandibular molar.

Other neck: No focal mucosal or submucosal lesions are present.
There multiple thyroid nodules. No dominant nodules evident. There
is no significant adenopathy. The salivary glands are within normal
limits. Diffuse ground-glass attenuation is present in the lungs
bilaterally. The superior mediastinum is within normal limits.

CTA HEAD

Anterior circulation: Dense atherosclerotic calcifications are
present within the cavernous internal carotid arteries bilaterally.
There is a moderate to high-grade stenosis within the ophthalmic
segment on the right. Mild to moderate narrowing is present on the
left. The ICA termini are intact. There moderate stenoses in the
left A1 segment a mild narrowing in the right.

The right M1 segment is occluded 10 mm from the bifurcation. The
left M1 segment is intact. The left MCA bifurcation is intact. There
is moderate segmental narrowing of left MCA branch vessels.

There is a high-grade stenosis of the left A2 segment. Tandem
stenoses are present in the ACA vessels bilaterally.

Peripheral collateral vessels are present on the right with
retrograde filling of MCA branches. There are collateral vessels on
the left is well.

Posterior circulation: High-grade stenoses are present in the
vertebral arteries at dural margin bilaterally. The left vertebral
artery is the dominant vessel the on the stenosis. The PICA origins
are not well seen. There is a moderate stenosis of the mid basilar
artery relative to the more distal segment. The PCA vessels both
originate from the basilar tip. Moderate to high-grade proximal PCA
stenoses are present bilaterally.

Venous sinuses: The dural sinuses are patent. The right transverse
sinus is dominant. The straight sinus and deep cerebral veins are
intact. Cortical veins are within normal limits.

Anatomic variants: None

Delayed phase: Not performed in the code stroke setting
IMPRESSION: 1. Proximal occlusion of the right M1 segment 10 mm from the ICA
terminus.
2. Moderate left mild right A1 segment stenoses.
3. Severe distally to tandem stenoses bilaterally.
4. Moderate diffuse left MCA small vessel disease.
5. Peripheral collaterals are present bilaterally.
6. High-grade stenosis of the vertebral arteries at the dural margin
bilaterally.
7. Moderate mid basilar artery stenosis.
8. High-grade proximal PCA stenoses bilaterally.
9. Atherosclerotic calcifications at the carotid bifurcations
bilaterally without significant stenosis.
10. Marked tortuosity of the cervical vasculature.
11. Moderate stenosis of the right vertebral artery at the C4 level.
12. Multilevel spondylosis of the cervical spine.
13. Diffuse ground-glass attenuation in both lungs likely reflects
atelectasis or edema.
These results were called by telephone at the time of interpretation
on 01/25/2015 at [DATE] to Dr. Christen, who verbally
acknowledged these results.
